# Patient Record
Sex: Male | Born: 1960 | Race: Black or African American | Hispanic: No | Marital: Single | Smoking: Never smoker
Health system: Southern US, Community
[De-identification: ages and names within clinical notes are randomized; demographics above are authoritative.]

## PROBLEM LIST (undated history)

## (undated) HISTORY — PX: KIDNEY DONATION: SHX685

## (undated) HISTORY — PX: TONSILLECTOMY: SUR1361

## (undated) HISTORY — PX: OTHER SURGICAL HISTORY: SHX169

---

## 2010-12-13 ENCOUNTER — Emergency Department (HOSPITAL_COMMUNITY): Payer: Self-pay

## 2010-12-13 ENCOUNTER — Inpatient Hospital Stay (HOSPITAL_COMMUNITY)
Admission: EM | Admit: 2010-12-13 | Discharge: 2010-12-14 | DRG: 313 | Disposition: A | Discharge status: Home / Self Care | Payer: Self-pay | Attending: Internal Medicine | Admitting: Internal Medicine

## 2010-12-13 DIAGNOSIS — Z8249 Family history of ischemic heart disease and other diseases of the circulatory system: Secondary | ICD-10-CM

## 2010-12-13 DIAGNOSIS — R0789 Other chest pain: Principal | ICD-10-CM | POA: Diagnosis present

## 2010-12-13 LAB — DIFFERENTIAL
Eosinophils Relative: 2 % (ref 0–5)
Lymphocytes Relative: 21 % (ref 12–46)
Lymphs Abs: 2 10*3/uL (ref 0.7–4.0)
Monocytes Absolute: 0.5 10*3/uL (ref 0.1–1.0)
Neutro Abs: 7 10*3/uL (ref 1.7–7.7)

## 2010-12-13 LAB — COMPREHENSIVE METABOLIC PANEL
ALT: 14 U/L (ref 0–53)
Alkaline Phosphatase: 69 U/L (ref 39–117)
BUN: 12 mg/dL (ref 6–23)
CO2: 25 mEq/L (ref 19–32)
Calcium: 9.3 mg/dL (ref 8.4–10.5)
GFR calc non Af Amer: 59 mL/min — ABNORMAL LOW (ref >60)
Glucose, Bld: 116 mg/dL — ABNORMAL HIGH (ref 70–99)
Potassium: 3.6 mEq/L (ref 3.5–5.1)
Total Protein: 7.2 g/dL (ref 6.0–8.3)

## 2010-12-13 LAB — CARDIAC PANEL(CRET KIN+CKTOT+MB+TROPI)
CK, MB: 0.9 ng/mL (ref 0.3–4.0)
Relative Index: 0.6 (ref 0.0–2.5)
Total CK: 163 U/L (ref 7–232)
Troponin I: <0.01 ng/mL (ref 0.00–0.06)

## 2010-12-13 LAB — RAPID URINE DRUG SCREEN, HOSP PERFORMED
Barbiturates: NOT DETECTED
Benzodiazepines: NOT DETECTED

## 2010-12-13 LAB — CBC
HCT: 38.7 % — ABNORMAL LOW (ref 39.0–52.0)
Hemoglobin: 12.5 g/dL — ABNORMAL LOW (ref 13.0–17.0)
MCHC: 32.3 g/dL (ref 30.0–36.0)
MCV: 91.3 fL (ref 78.0–100.0)
RDW: 14 % (ref 11.5–15.5)

## 2010-12-13 LAB — D-DIMER, QUANTITATIVE: D-Dimer, Quant: 0.26 ug/mL-FEU (ref 0.00–0.48)

## 2010-12-14 LAB — HEMOGLOBIN A1C
Hgb A1c MFr Bld: 5.8 % — ABNORMAL HIGH (ref <5.7)
Mean Plasma Glucose: 120 mg/dL — ABNORMAL HIGH (ref <117)

## 2010-12-14 LAB — TSH: TSH: 1.734 u[IU]/mL (ref 0.350–4.500)

## 2010-12-14 LAB — DIFFERENTIAL
Basophils Absolute: 0 10*3/uL (ref 0.0–0.1)
Basophils Relative: 0 % (ref 0–1)
Eosinophils Absolute: 0.2 10*3/uL (ref 0.0–0.7)
Eosinophils Relative: 2 % (ref 0–5)
Lymphocytes Relative: 24 % (ref 12–46)
Lymphs Abs: 2 10*3/uL (ref 0.7–4.0)
Monocytes Absolute: 0.4 10*3/uL (ref 0.1–1.0)
Monocytes Relative: 5 % (ref 3–12)
Neutro Abs: 5.7 10*3/uL (ref 1.7–7.7)
Neutrophils Relative %: 69 % (ref 43–77)

## 2010-12-14 LAB — CBC
HCT: 35.8 % — ABNORMAL LOW (ref 39.0–52.0)
Hemoglobin: 11.6 g/dL — ABNORMAL LOW (ref 13.0–17.0)
MCH: 29.4 pg (ref 26.0–34.0)
MCHC: 32.4 g/dL (ref 30.0–36.0)
MCV: 90.9 fL (ref 78.0–100.0)
Platelets: 210 10*3/uL (ref 150–400)
RBC: 3.94 MIL/uL — ABNORMAL LOW (ref 4.22–5.81)
RDW: 13.7 % (ref 11.5–15.5)
WBC: 8.3 10*3/uL (ref 4.0–10.5)

## 2010-12-14 LAB — BASIC METABOLIC PANEL
BUN: 10 mg/dL (ref 6–23)
CO2: 23 mEq/L (ref 19–32)
Calcium: 8.6 mg/dL (ref 8.4–10.5)
Chloride: 104 mEq/L (ref 96–112)
Creatinine, Ser: 1.28 mg/dL (ref 0.4–1.5)
GFR calc Af Amer: >60 mL/min (ref >60)
GFR calc non Af Amer: 60 mL/min — ABNORMAL LOW (ref >60)
Glucose, Bld: 116 mg/dL — ABNORMAL HIGH (ref 70–99)
Potassium: 3.9 mEq/L (ref 3.5–5.1)
Sodium: 133 mEq/L — ABNORMAL LOW (ref 135–145)

## 2010-12-14 LAB — LIPID PANEL
Cholesterol: 189 mg/dL (ref 0–200)
HDL: 39 mg/dL — ABNORMAL LOW (ref >39)
LDL Cholesterol: 132 mg/dL — ABNORMAL HIGH (ref 0–99)
Total CHOL/HDL Ratio: 4.8 RATIO
Triglycerides: 89 mg/dL (ref <150)
VLDL: 18 mg/dL (ref 0–40)

## 2010-12-14 LAB — CARDIAC PANEL(CRET KIN+CKTOT+MB+TROPI)
CK, MB: 0.7 ng/mL (ref 0.3–4.0)
Relative Index: 0.5 (ref 0.0–2.5)
Total CK: 143 U/L (ref 7–232)
Troponin I: <0.01 ng/mL (ref 0.00–0.06)

## 2010-12-20 NOTE — H&P (Signed)
[name], [name]              ACCOUNT NO.:  [id number]  MEDICAL RECORD NO.:  [id number]           PATIENT TYPE:  I  LOCATION:  A304                          FACILITY:  [facility]  PHYSICIAN:  Celso Amy, MD   DATE OF BIRTH:  [birth year]  DATE OF ADMISSION:  12/13/2010 DATE OF DISCHARGE:  LH                             HISTORY & PHYSICAL   PRIMARY CARE:  The patient does not have any primary care.  CHIEF COMPLAINT:  Chest pain.  HISTORY OF PRESENT ILLNESS:  The patient is a 50 year old African American male with no significant past medical history who presented to ER with chief complaint of chest pain.  History of present illness dates back to this past Wednesday when the patient had sudden onset of chest pain mid sternal, feel like somebody squeezing my heart.  The patient says that the pain last around 30 minutes only.  It is intermittent. The patient is not aware of any exacerbating or any relieving factors. The patient says the pain comes on and goes off by itself.  The patient says at the time of presentation, the pain was 0/10 and the pain was made worse with nitroglycerin.  No cough, no fever no chills.  No complaint of any upper respiratory symptoms.  No complaint of recent travel.  No complaint of any trauma or fall.  No complaint of any focal motor neurological deficits.  No complaint of neck pain.  REVIEW OF SYSTEMS:  Negative.  ALLERGIES:  The patient is allergic to [facility]  This causes him to have tongue swelling and the patient cannot breath.  SOCIAL HISTORY:  The patient is nonsmoker and nondrinker.  Denies illegal drug abuse.  PAST MEDICAL HISTORY:  No significant past medical history.  FAMILY HISTORY:  The patient has strong family history of coronary artery disease.  His father died at the age of 76 from MI.  The patient's grandfather died at the age of 77 with CHF.  MEDICATION:  The patient is not on any prescribed medication.  PHYSICAL  EXAMINATION:  VITALS:  Blood pressure 109/60, pulse 93, respiratory rate is 16, temperature afebrile. GENERAL:  The patient is awake, alert, well-built, lying comfortably on the bed, oriented to time, place and person. HEENT:  Pupils equally reactive to light and accommodation.  Extraocular movements were intact and head atraumatic and normocephalic. NECK:  Supple.  JVP is not elevated. RESPIRATORY:  No acute respiratory distress.  Chest is clear to auscultation bilaterally.  No rhonchi.  No crackles were heard. CARDIOVASCULAR:  S1 and S2 regular in rate and rhythm.  No murmurs or rubs were appreciated. GI:  Abdomen is obese.  Bowel sounds present, soft, nontender and nondistended. EXTREMITIES:  No extremity edema. CNS:  Cranial nerves II to XII were grossly intact.  The patient has both upper extremity and lower extremity strength and normal 5/5.  LABORATORY DATA:  WBC 9.7, the patient's hemoglobin 12.5, the patient's platelet 242.  Sodium 138, potassium 3.6, the patient's chloride 106, bicarb 25, BUN 12, serum creatinine 1.3, glucose 116.  The patient's first set of trop is 0.05.  Chest x-ray shows  no acute cardiopulmonary process.  EKG is normal sinus rhythm.  D-dimer is normal at 0.26.  IMPRESSION: 1. Cardiovascular.  The patient is being admitted with chest pain.     The patient has risk factor of obesity and a strong family history     of coronary artery disease. 2. Deep venous thrombosis.  We will keep the patient on DVT     prophylaxis.  PLAN: 1. We will admit the patient to tele. 2. We will cycle trop's q.8 x3. 3. We will start aspirin. 4. The patient's blood pressure is in the lower sides, so we will hold     beta-blockers for now. 5. We will get 2-D echo. 6. We will get urine tox screen. 7. The patient's further course depends on how the patient does during     his hospital stay.     Celso Amy, MD     MB/MEDQ  D:  12/13/2010  T:  12/14/2010  Job:   [id number]  Electronically Signed by Celso Amy M.D. on 12/20/2010 03:09:16 PM

## 2010-12-20 NOTE — Discharge Summary (Signed)
  NAME:  [name], [name]              ACCOUNT NO.:  [id number]  MEDICAL RECORD NO.:  [id number]           PATIENT TYPE:  I  LOCATION:  A304                          FACILITY:  [facility]  PHYSICIAN:  Celso Amy, MD   DATE OF BIRTH:  Jun 09, 1961  DATE OF ADMISSION:  12/13/2010 DATE OF DISCHARGE:  LH                              DISCHARGE SUMMARY   PRIMARY CARE PHYSICIAN:  The patient does not have primary care at the time of admission as the patient was unlisted and [employer] were on-call on that day, so the patient will see Dr. Regino Schultze at discharge.  LAB TESTS:  The patient's troponin were negative.  IMAGING:  The patient had chest x-ray, which was negative.  EKG had normal sinus rhythm.  HOSPITAL COURSE AND TREATMENT: 1. Please see H and P for details.  The patient was admitted with     chest pain.  The patient was admitted on telemetry floor and was     ruled out for MI.  The patient's risk factors for coronary artery    disease are strong family history and obesity. 2. DVT.  The patient was kept on DVT prophylaxis. 3. Social.  The patient is being discharged after being ruled out.  FOLLOWUP:  The patient is to follow up with [facility] for his primary care.  The patient will need a stress test as outpatient.  ADMITTING DIAGNOSIS:  Rule out myocardial infarction.  DISCHARGE DIAGNOSIS: 1. Myocardial infarction ruled out. 2. Family history of coronary artery disease.  DISCHARGE MEDICATIONS: 1. Aspirin 81 mg p.o. daily. 2. Lipitor 10 mg p.o. daily.     Celso Amy, MD     MB/MEDQ  D:  12/14/2010  T:  12/15/2010  Job:  [id number]  Electronically Signed by Celso Amy M.D. on 12/20/2010 03:09:29 PM

## 2012-01-31 ENCOUNTER — Encounter (HOSPITAL_COMMUNITY): Payer: Self-pay | Admitting: *Deleted

## 2012-01-31 ENCOUNTER — Emergency Department (HOSPITAL_COMMUNITY): Payer: PRIVATE HEALTH INSURANCE

## 2012-01-31 ENCOUNTER — Emergency Department (HOSPITAL_COMMUNITY)
Admission: EM | Admit: 2012-01-31 | Discharge: 2012-02-01 | Payer: PRIVATE HEALTH INSURANCE | Attending: Emergency Medicine | Admitting: Emergency Medicine

## 2012-01-31 DIAGNOSIS — R42 Dizziness and giddiness: Secondary | ICD-10-CM | POA: Insufficient documentation

## 2012-01-31 DIAGNOSIS — R51 Headache: Secondary | ICD-10-CM | POA: Insufficient documentation

## 2012-01-31 DIAGNOSIS — E785 Hyperlipidemia, unspecified: Secondary | ICD-10-CM | POA: Insufficient documentation

## 2012-01-31 DIAGNOSIS — R112 Nausea with vomiting, unspecified: Secondary | ICD-10-CM | POA: Insufficient documentation

## 2012-01-31 LAB — CBC
Hemoglobin: 12.3 g/dL — ABNORMAL LOW (ref 13.0–17.0)
MCH: 28.7 pg (ref 26.0–34.0)
MCHC: 32.6 g/dL (ref 30.0–36.0)
Platelets: 305 10*3/uL (ref 150–400)
RBC: 4.28 MIL/uL (ref 4.22–5.81)

## 2012-01-31 LAB — GLUCOSE, CAPILLARY: Glucose-Capillary: 149 mg/dL — ABNORMAL HIGH (ref 70–99)

## 2012-01-31 LAB — DIFFERENTIAL
Basophils Relative: 0 % (ref 0–1)
Eosinophils Absolute: 0 10*3/uL (ref 0.0–0.7)
Lymphs Abs: 1 10*3/uL (ref 0.7–4.0)
Neutro Abs: 8.5 10*3/uL — ABNORMAL HIGH (ref 1.7–7.7)
Neutrophils Relative %: 86 % — ABNORMAL HIGH (ref 43–77)

## 2012-01-31 LAB — BASIC METABOLIC PANEL
Chloride: 103 mEq/L (ref 96–112)
GFR calc Af Amer: 78 mL/min — ABNORMAL LOW (ref >90)
GFR calc non Af Amer: 68 mL/min — ABNORMAL LOW (ref >90)
Potassium: 4.1 mEq/L (ref 3.5–5.1)
Sodium: 138 mEq/L (ref 135–145)

## 2012-01-31 MED ORDER — SODIUM CHLORIDE 0.9 % IV BOLUS (SEPSIS)
1000.0000 mL | Freq: Once | INTRAVENOUS | Status: AC
  Administered 2012-01-31: 1000 mL via INTRAVENOUS

## 2012-01-31 MED ORDER — METOCLOPRAMIDE HCL 5 MG/ML IJ SOLN
10.0000 mg | Freq: Once | INTRAMUSCULAR | Status: AC
  Administered 2012-01-31: 10 mg via INTRAVENOUS
  Filled 2012-01-31: qty 2

## 2012-01-31 MED ORDER — HYDROMORPHONE HCL PF 1 MG/ML IJ SOLN
1.0000 mg | Freq: Once | INTRAMUSCULAR | Status: AC
  Administered 2012-01-31: 1 mg via INTRAVENOUS

## 2012-01-31 MED ORDER — HYDROMORPHONE HCL PF 1 MG/ML IJ SOLN: 1.0000 mg | INTRAMUSCULAR | Status: DC | PRN

## 2012-01-31 MED ORDER — HYDROMORPHONE HCL PF 1 MG/ML IJ SOLN
INTRAMUSCULAR | Status: AC
  Administered 2012-01-31: 1 mg via INTRAVENOUS
  Filled 2012-01-31: qty 1

## 2012-01-31 MED ORDER — DIPHENHYDRAMINE HCL 50 MG/ML IJ SOLN
25.0000 mg | Freq: Once | INTRAMUSCULAR | Status: AC
  Administered 2012-01-31: 25 mg via INTRAVENOUS
  Filled 2012-01-31: qty 1

## 2012-01-31 NOTE — ED Notes (Signed)
I have personally seen and examined the patient.  I have discussed the plan of care with the resident.  I have reviewed the documentation on PMH/FH/Soc. History.  I have reviewed the documentation of the resident and agree.  I performed the lumbar puncture documented  Joya Gaskins, MD 01/31/12 2350

## 2012-01-31 NOTE — ED Provider Notes (Signed)
9:13 PM  Pt seen with resident He reports HA that woke him up with intense vomiting upon movement He reports never having this HA previously He reports brief syncopal event due to pain but no injury reported No focal arm/leg drift, face is symmetric Will start with CT head Pt denies trauma Pt currently in handcuffs with office present      Date: 01/31/2012  Rate: 93  Rhythm: normal sinus rhythm  QRS Axis: normal  Intervals: normal  ST/T Wave abnormalities: nonspecific ST changes  Conduction Disutrbances:none  Narrative Interpretation:   Old EKG Reviewed: unchanged  10:52 PM CT head negative Given history, concerning for [facility], will proceed with LP Pt agreeable He is not on anticoagulants No h/o back surgery or back pain  11:42 PM LP completed without issue, no complications, pt is neuro intact after procedure At signout, f/u on CSF results.  If gram stain negative and CSF does not reveal significant RBCs, safe for d/c but should have f/u with neuro/nsgy given family h/o aneurysms  Joya Gaskins, MD 01/31/12 [phone]

## 2012-01-31 NOTE — Discharge Instructions (Signed)

## 2012-01-31 NOTE — ED Notes (Signed)
Pt arrived via [employer] [facility], c/o vomiting starting this am

## 2012-01-31 NOTE — ED Notes (Addendum)
MD preforming lumbar puncture at this time assisted by MD resident

## 2012-01-31 NOTE — ED Provider Notes (Signed)
History     CSN: [id number]  Arrival date & time 01/31/12  1959   First MD Initiated Contact with Patient 01/31/12 2009      Chief Complaint  Patient presents with  . headache    HPI The patient is a 51 year old man with a past medical history of hyperlipidemia, post status of left kidney donation (2004), who presents with severe headache.  Patient reports that  he woke up with severe headache at 4:00 this morning. He headache is located at the top of his head, 9/10 in severity, sharp and non-radiation. It is aggravated by sitting up and alleviated by laying flat. It is associated with nausea and vomiting. Patient has vomited many times food materials without blood in it. His headache is also associated with blurry vision,  but no hearing change or photophobia. Patient states that when he tried to get up and go to the bathroom, he passed out. He did not bite his tongue or injure any part of his body. He lost control of his bladder, but not bowel movement. At the beginning he had tingling sensation in his toes bilaterally, but does not have it now when I interviewed the patient. There's no weakness or numbness in his extremities. Patient denies any recent injury to his head.   Of note, patient mentioned that his mother died of  brain vascular aneurysm at age of 108.  Denies fever, chills, cough, chest pain, SOB,  diarrhea, constipation, dysuria, urgency, frequency, hematuria, joint pain or leg swelling.  History reviewed. No pertinent past medical history.  Past Surgical History  Procedure Date  . Kidney donation     No family history on file.  History  Substance Use Topics  . Smoking status: Never Smoker   . Smokeless tobacco: Not on file  . Alcohol Use: No    Review of Systems  Constitutional: Negative for fever, chills, weight loss and diaphoresis.  HENT: Negative for hearing loss, ear pain, nosebleeds, congestion, sore throat, tinnitus and ear discharge.   Eyes: Positive  for blurred vision. Negative for double vision, photophobia, pain, discharge and redness.  Respiratory: Negative for apnea, cough, choking, chest tightness, shortness of breath, wheezing and stridor.   Cardiovascular: Negative for chest pain, palpitations and leg swelling.  Gastrointestinal: Positive for nausea, vomiting and abdominal pain. Negative for diarrhea, constipation, blood in stool, abdominal distention, anal bleeding and rectal pain.       Patient has pain in his abdomen when he vomits. He thinks that is due to vomiting-induced abdominal wall muscle pain.  Genitourinary: Negative for dysuria, urgency, frequency, hematuria, difficulty urinating and penile pain.  Musculoskeletal: Negative for back pain, joint swelling, arthralgias and gait problem.  Skin: Negative for color change, itching, pallor, rash and wound.  Neurological: Positive for dizziness and headaches. Negative for tremors, seizures, syncope, facial asymmetry, weakness, light-headedness and numbness.  Hematological: Negative for adenopathy. Does not bruise/bleed easily.  Psychiatric/Behavioral: Negative for hallucinations, behavioral problems, confusion, dysphoric mood, decreased concentration and agitation.    Review of Systems  Constitutional: Negative for fever, chills, weight loss and diaphoresis.  HENT: Negative for hearing loss, ear pain, nosebleeds, congestion, sore throat, tinnitus and ear discharge.   Eyes: Positive for blurred vision. Negative for double vision, photophobia, pain, discharge and redness.  Respiratory: Negative for apnea, cough, choking, chest tightness, shortness of breath, wheezing and stridor.   Cardiovascular: Negative for chest pain, palpitations and leg swelling.  Gastrointestinal: Positive for nausea, vomiting and abdominal pain. Negative for diarrhea,  constipation, blood in stool, abdominal distention, anal bleeding and rectal pain.       Patient has pain in his abdomen when he vomits. He  thinks that is due to vomiting-induced abdominal wall muscle pain.  Genitourinary: Negative for dysuria, urgency, frequency, hematuria, difficulty urinating and penile pain.  Musculoskeletal: Negative for back pain, joint swelling, arthralgias and gait problem.  Skin: Negative for color change, itching, pallor, rash and wound.  Neurological: Positive for dizziness and headaches. Negative for tremors, seizures, syncope, facial asymmetry, weakness, light-headedness and numbness.  Endo/Heme/Allergies: Negative for adenopathy. Does not bruise/bleed easily.  Psychiatric/Behavioral: Negative for hallucinations, behavioral problems, confusion, dysphoric mood, decreased concentration and agitation.     Allergies  Shellfish allergy  Home Medications  No current outpatient prescriptions on file.  BP 134/78  Pulse 114  Temp(Src) 98.9 F (37.2 C) (Oral)  Resp 20  Ht 5\' 8"  (1.727 m)  Wt 255 lb (115.667 kg)  BMI 38.77 kg/m2  SpO2 94%  Physical Exam  General: resting in bed, not in acute distress HEENT: PERRL, EOMI, no scleral icterus Cardiac: S1/S2, RRR, No murmurs, gallops or rubs Pulm: Good air movement bilaterally, Clear to auscultation bilaterally, No rales, wheezing, rhonchi or rubs. Abd: Soft,  nondistended, nontender, no rebound pain, no organomegaly, BS present Ext: No rashes or edema, 2+DP/PT pulse bilaterally Skin: no rashes. No skin bruise. Neuro: alert and oriented X3, cranial nerves II-XII grossly intact, muscle strength 5/5 in all extremeties,  sensation to light touch intact. Negative Kernig's sign and Brudzinski's sign. No Babinski's sign. 2+ brachial and knee reflexes bilaterally. Psych.: patient is not psychotic, no suicidal or hemocidal ideation.    ED Course  LUMBAR PUNCTURE Date/Time: 01/31/2012 11:30 PM Performed by: Joya Gaskins Authorized by: Joya Gaskins Consent: Verbal consent obtained. Written consent obtained. Risks and benefits: risks, benefits  and alternatives were discussed Consent given by: patient Patient understanding: patient states understanding of the procedure being performed Patient identity confirmed: verbally with patient and arm band Time out: Immediately prior to procedure a "time out" was called to verify the correct patient, procedure, equipment, support staff and site/side marked as required. Indications: evaluation for subarachnoid hemorrhage Anesthesia: local infiltration Local anesthetic: lidocaine 1% without epinephrine Anesthetic total: 5 ml Patient sedated: no Lumbar space: L4-L5 interspace Patient's position: sitting Needle gauge: 20 Needle type: spinal needle - Quincke tip Needle length: 3.5 in Number of attempts: 2 Fluid appearance: clear Tubes of fluid: 4 Total volume: 8 ml Post-procedure: site cleaned and adhesive bandage applied Patient tolerance: Patient tolerated the procedure well with no immediate complications.   (including critical care time)  Patient's headache is likely due to subarachnoid hemorrhage given his sudden onset and severe pain, also his positive family history. Other differential diagnosis includes, but less likely, meningitis (althoug patient is incarceration which put him at higher risk for Nessirial meningitis. But he dose not have fever, no neck stiffness,  Has negative Kernig's sign and Brudzinski's sign), cluster headache (not typical in symptom, not located unilaterally) and migraine headache (patient does not have history for this diagnosis).   Will do CT-head without contrast. Will get CBC and BMP. Will treat symptomatically for pain and nausea.  Labs Reviewed  GLUCOSE, CAPILLARY - Abnormal; Notable for the following:    Glucose-Capillary 149 (*)    All other components within normal limits  CBC  DIFFERENTIAL  BASIC METABOLIC PANEL   No results found.   No diagnosis found.    MDM  Lorretta Harp, MD 01/31/12 [id number]  Joya Gaskins, MD 01/31/12 [phone]

## 2012-01-31 NOTE — ED Notes (Signed)
Pt tolerated lumbar puncture. Vitals stable no signs of distress

## 2012-02-01 LAB — CSF CELL COUNT WITH DIFFERENTIAL
RBC Count, CSF: 10 /mm3 — ABNORMAL HIGH
RBC Count, CSF: 3 /mm3 — ABNORMAL HIGH
Tube #: 4
WBC, CSF: 1 /mm3 (ref 0–5)
WBC, CSF: 7 /mm3 — ABNORMAL HIGH (ref 0–5)

## 2012-02-01 LAB — PROTEIN, CSF: Total  Protein, CSF: 20 mg/dL (ref 15–45)

## 2012-02-01 LAB — GLUCOSE, CSF: Glucose, CSF: 88 mg/dL — ABNORMAL HIGH (ref 43–76)

## 2012-02-01 NOTE — ED Provider Notes (Signed)
0015 Assumed care/disposition of patient. Patient with HA associated with nausea, vomiting and syncopal episode. Negative Ct. LP without blood. Awaiting CSF. 0200 CSF without frank infection. Colorless, clear. RBCs range from 3-10. WBC 7 with 87 lymph in tube 1 but too few to count in tube 4. Findings are not c/w with SAH. Patient has received IVF. No c/o headache currently. Discussed results with patient. Will discharge back to facility.  Nicoletta Dress. Colon Branch, MD 02/01/12 [phone]

## 2012-02-05 LAB — CSF CULTURE W GRAM STAIN: Culture: NO GROWTH

## 2015-06-19 ENCOUNTER — Emergency Department (HOSPITAL_COMMUNITY)

## 2015-06-19 ENCOUNTER — Emergency Department (HOSPITAL_COMMUNITY): Payer: Self-pay

## 2015-06-19 ENCOUNTER — Emergency Department (HOSPITAL_COMMUNITY)
Admission: EM | Admit: 2015-06-19 | Discharge: 2015-06-19 | Disposition: A | Discharge status: Home / Self Care | Payer: Self-pay | Attending: Emergency Medicine | Admitting: Emergency Medicine

## 2015-06-19 ENCOUNTER — Encounter (HOSPITAL_COMMUNITY): Payer: Self-pay | Admitting: Cardiology

## 2015-06-19 DIAGNOSIS — S299XXA Unspecified injury of thorax, initial encounter: Secondary | ICD-10-CM | POA: Insufficient documentation

## 2015-06-19 DIAGNOSIS — S60512A Abrasion of left hand, initial encounter: Secondary | ICD-10-CM | POA: Insufficient documentation

## 2015-06-19 DIAGNOSIS — Y998 Other external cause status: Secondary | ICD-10-CM | POA: Insufficient documentation

## 2015-06-19 DIAGNOSIS — S80211A Abrasion, right knee, initial encounter: Secondary | ICD-10-CM | POA: Insufficient documentation

## 2015-06-19 DIAGNOSIS — Y92410 Unspecified street and highway as the place of occurrence of the external cause: Secondary | ICD-10-CM | POA: Insufficient documentation

## 2015-06-19 DIAGNOSIS — R42 Dizziness and giddiness: Secondary | ICD-10-CM | POA: Insufficient documentation

## 2015-06-19 DIAGNOSIS — S0101XA Laceration without foreign body of scalp, initial encounter: Secondary | ICD-10-CM | POA: Insufficient documentation

## 2015-06-19 DIAGNOSIS — S60511A Abrasion of right hand, initial encounter: Secondary | ICD-10-CM | POA: Insufficient documentation

## 2015-06-19 DIAGNOSIS — Y9389 Activity, other specified: Secondary | ICD-10-CM | POA: Insufficient documentation

## 2015-06-19 DIAGNOSIS — S060X0A Concussion without loss of consciousness, initial encounter: Secondary | ICD-10-CM | POA: Insufficient documentation

## 2015-06-19 LAB — URINALYSIS, ROUTINE W REFLEX MICROSCOPIC
Bilirubin Urine: NEGATIVE
GLUCOSE, UA: NEGATIVE mg/dL
Ketones, ur: NEGATIVE mg/dL
Nitrite: NEGATIVE
PROTEIN: NEGATIVE mg/dL
Urobilinogen, UA: 0.2 mg/dL (ref 0.0–1.0)
pH: 6 (ref 5.0–8.0)

## 2015-06-19 LAB — COMPREHENSIVE METABOLIC PANEL
ALBUMIN: 3.8 g/dL (ref 3.5–5.0)
ALT: 19 U/L (ref 17–63)
AST: 44 U/L — AB (ref 15–41)
Alkaline Phosphatase: 65 U/L (ref 38–126)
Anion gap: 6 (ref 5–15)
BUN: 11 mg/dL (ref 6–20)
CHLORIDE: 109 mmol/L (ref 101–111)
CO2: 25 mmol/L (ref 22–32)
CREATININE: 1.31 mg/dL — AB (ref 0.61–1.24)
Calcium: 9 mg/dL (ref 8.9–10.3)
GFR calc Af Amer: >60 mL/min (ref >60)
GLUCOSE: 108 mg/dL — AB (ref 65–99)
POTASSIUM: 3.8 mmol/L (ref 3.5–5.1)
SODIUM: 140 mmol/L (ref 135–145)
Total Bilirubin: 0.3 mg/dL (ref 0.3–1.2)
Total Protein: 7.5 g/dL (ref 6.5–8.1)

## 2015-06-19 LAB — CBC WITH DIFFERENTIAL/PLATELET
BASOS ABS: 0 10*3/uL (ref 0.0–0.1)
BASOS PCT: 0 %
EOS ABS: 0.1 10*3/uL (ref 0.0–0.7)
EOS PCT: 1 %
HCT: 39.2 % (ref 39.0–52.0)
Hemoglobin: 12.4 g/dL — ABNORMAL LOW (ref 13.0–17.0)
LYMPHS PCT: 14 %
Lymphs Abs: 1.3 10*3/uL (ref 0.7–4.0)
MCH: 28.7 pg (ref 26.0–34.0)
MCHC: 31.6 g/dL (ref 30.0–36.0)
MCV: 90.7 fL (ref 78.0–100.0)
MONO ABS: 0.5 10*3/uL (ref 0.1–1.0)
Monocytes Relative: 5 %
Neutro Abs: 7.6 10*3/uL (ref 1.7–7.7)
Neutrophils Relative %: 80 %
PLATELETS: 241 10*3/uL (ref 150–400)
RBC: 4.32 MIL/uL (ref 4.22–5.81)
RDW: 14.3 % (ref 11.5–15.5)
WBC: 9.4 10*3/uL (ref 4.0–10.5)

## 2015-06-19 LAB — URINE MICROSCOPIC-ADD ON

## 2015-06-19 MED ORDER — FENTANYL CITRATE (PF) 100 MCG/2ML IJ SOLN
50.0000 ug | Freq: Once | INTRAMUSCULAR | Status: AC
  Administered 2015-06-19: 50 ug via INTRAVENOUS
  Filled 2015-06-19: qty 2

## 2015-06-19 MED ORDER — SODIUM CHLORIDE 0.9 % IV BOLUS (SEPSIS)
500.0000 mL | Freq: Once | INTRAVENOUS | Status: AC
  Administered 2015-06-19: 500 mL via INTRAVENOUS

## 2015-06-19 MED ORDER — IOHEXOL 300 MG/ML  SOLN
100.0000 mL | Freq: Once | INTRAMUSCULAR | Status: AC | PRN
  Administered 2015-06-19: 100 mL via INTRAVENOUS

## 2015-06-19 MED ORDER — SODIUM BICARBONATE 4 % IV SOLN
5.0000 mL | Freq: Once | INTRAVENOUS | Status: DC
  Filled 2015-06-19: qty 5

## 2015-06-19 MED ORDER — LIDOCAINE HCL (PF) 1 % IJ SOLN
5.0000 mL | Freq: Once | INTRAMUSCULAR | Status: DC
  Filled 2015-06-19: qty 5

## 2015-06-19 MED ORDER — POVIDONE-IODINE 10 % EX SOLN
CUTANEOUS | Status: AC
  Filled 2015-06-19: qty 118

## 2015-06-19 MED ORDER — MORPHINE SULFATE (PF) 4 MG/ML IV SOLN
4.0000 mg | Freq: Once | INTRAVENOUS | Status: AC
  Administered 2015-06-19: 4 mg via INTRAVENOUS
  Filled 2015-06-19: qty 1
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
## 2015-06-19 NOTE — ED Notes (Signed)
Dr Pickering at bedside,  
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
## 2015-07-05 ENCOUNTER — Emergency Department (HOSPITAL_COMMUNITY)
Admission: EM | Admit: 2015-07-05 | Discharge: 2015-07-05 | Disposition: A | Discharge status: Home / Self Care | Attending: Emergency Medicine | Admitting: Emergency Medicine

## 2015-07-05 ENCOUNTER — Encounter (HOSPITAL_COMMUNITY): Payer: Self-pay | Admitting: *Deleted

## 2015-07-05 DIAGNOSIS — R42 Dizziness and giddiness: Secondary | ICD-10-CM | POA: Insufficient documentation

## 2015-07-05 DIAGNOSIS — Z4802 Encounter for removal of sutures: Secondary | ICD-10-CM
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
## 2015-07-05 NOTE — ED Notes (Signed)
Suture removal. Sutures placed to left scalp on 10/5.
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
## 2015-12-24 ENCOUNTER — Emergency Department (HOSPITAL_COMMUNITY)
Admission: EM | Admit: 2015-12-24 | Discharge: 2015-12-24 | Disposition: A | Discharge status: Home / Self Care | Attending: Emergency Medicine | Admitting: Emergency Medicine

## 2015-12-24 ENCOUNTER — Encounter (HOSPITAL_COMMUNITY): Payer: Self-pay | Admitting: Emergency Medicine

## 2015-12-24 DIAGNOSIS — S61011A Laceration without foreign body of right thumb without damage to nail, initial encounter: Secondary | ICD-10-CM

## 2015-12-24 DIAGNOSIS — Y929 Unspecified place or not applicable: Secondary | ICD-10-CM | POA: Insufficient documentation

## 2015-12-24 DIAGNOSIS — W260XXA Contact with knife, initial encounter: Secondary | ICD-10-CM | POA: Insufficient documentation

## 2015-12-24 DIAGNOSIS — Y9389 Activity, other specified: Secondary | ICD-10-CM | POA: Insufficient documentation

## 2015-12-24 DIAGNOSIS — Z23 Encounter for immunization: Secondary | ICD-10-CM | POA: Insufficient documentation

## 2015-12-24 DIAGNOSIS — Y999 Unspecified external cause status: Secondary | ICD-10-CM | POA: Insufficient documentation

## 2015-12-24 MED ORDER — CEPHALEXIN 500 MG PO CAPS: 500.0000 mg | ORAL_CAPSULE | Freq: Four times a day (QID) | ORAL | Status: AC

## 2015-12-24 MED ORDER — TETANUS-DIPHTH-ACELL PERTUSSIS 5-2.5-18.5 LF-MCG/0.5 IM SUSP
0.5000 mL | Freq: Once | INTRAMUSCULAR | Status: AC
  Administered 2015-12-24: 0.5 mL via INTRAMUSCULAR
  Filled 2015-12-24: qty 0.5

## 2015-12-24 MED ORDER — LIDOCAINE HCL (PF) 1 % IJ SOLN
5.0000 mL | Freq: Once | INTRAMUSCULAR | Status: DC
  Filled 2015-12-24: qty 5

## 2015-12-24 NOTE — ED Notes (Signed)
Patient cleaned hands with soap and water at sink in patient room. Covered wound with tefla and wrapped in sterile gauze. Patient tolerated well.
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
## 2015-12-24 NOTE — Discharge Instructions (Signed)
Laceration Care, Adult  A laceration is a cut that goes through all layers of the skin. The cut also goes into the tissue that is right under the skin. Some cuts heal on their own. Others need to be closed with stitches (sutures), staples, skin adhesive strips, or wound glue. Taking care of your cut lowers your risk of infection and helps your cut to heal better.  HOW TO TAKE CARE OF YOUR CUT  For stitches or staples:  · Keep the wound clean and dry.  · If you were given a bandage (dressing), you should change it at least one time per day or as told by your doctor. You should also change it if it gets wet or dirty.  · Keep the wound completely dry for the first 24 hours or as told by your doctor. After that time, you may take a shower or a bath. However, make sure that the wound is not soaked in water until after the stitches or staples have been removed.  · Clean the wound one time each day or as told by your doctor:    Wash the wound with soap and water.    Rinse the wound with water until all of the soap comes off.    Pat the wound dry with a clean towel. Do not rub the wound.  · After you clean the wound, put a thin layer of antibiotic ointment on it as told by your doctor. This ointment:    Helps to prevent infection.    Keeps the bandage from sticking to the wound.  · Have your stitches or staples removed as told by your doctor.  If your doctor used skin adhesive strips:   · Keep the wound clean and dry.  · If you were given a bandage, you should change it at least one time per day or as told by your doctor. You should also change it if it gets dirty or wet.  · Do not get the skin adhesive strips wet. You can take a shower or a bath, but be careful to keep the wound dry.  · If the wound gets wet, pat it dry with a clean towel. Do not rub the wound.  · Skin adhesive strips fall off on their own. You can trim the strips as the wound heals. Do not remove any strips that are still stuck to the wound. They will  fall off after a while.  If your doctor used wound glue:  · Try to keep your wound dry, but you may briefly wet it in the shower or bath. Do not soak the wound in water, such as by swimming.  · After you take a shower or a bath, gently pat the wound dry with a clean towel. Do not rub the wound.  · Do not do any activities that will make you really sweaty until the skin glue has fallen off on its own.  · Do not apply liquid, cream, or ointment medicine to your wound while the skin glue is still on.  · If you were given a bandage, you should change it at least one time per day or as told by your doctor. You should also change it if it gets dirty or wet.  · If a bandage is placed over the wound, do not let the tape for the bandage touch the skin glue.  · Do not pick at the glue. The skin glue usually stays on for 5-10 days. Then, it   falls off of the skin.  General Instructions   · To help prevent scarring, make sure to cover your wound with sunscreen whenever you are outside after stitches are removed, after adhesive strips are removed, or when wound glue stays in place and the wound is healed. Make sure to wear a sunscreen of at least 30 SPF.  · Take over-the-counter and prescription medicines only as told by your doctor.  · If you were given antibiotic medicine or ointment, take or apply it as told by your doctor. Do not stop using the antibiotic even if your wound is getting better.  · Do not scratch or pick at the wound.  · Keep all follow-up visits as told by your doctor. This is important.  · Check your wound every day for signs of infection. Watch for:    Redness, swelling, or pain.    Fluid, blood, or pus.  · Raise (elevate) the injured area above the level of your heart while you are sitting or lying down, if possible.  GET HELP IF:  · You got a tetanus shot and you have any of these problems at the injection site:    Swelling.    Very bad pain.    Redness.    Bleeding.  · You have a fever.  · A wound that was  closed breaks open.  · You notice a bad smell coming from your wound or your bandage.  · You notice something coming out of the wound, such as wood or glass.  · Medicine does not help your pain.  · You have more redness, swelling, or pain at the site of your wound.  · You have fluid, blood, or pus coming from your wound.  · You notice a change in the color of your skin near your wound.  · You need to change the bandage often because fluid, blood, or pus is coming from the wound.  · You start to have a new rash.  · You start to have numbness around the wound.  GET HELP RIGHT AWAY IF:  · You have very bad swelling around the wound.  · Your pain suddenly gets worse and is very bad.  · You notice painful lumps near the wound or on skin that is anywhere on your body.  · You have a red streak going away from your wound.  · The wound is on your hand or foot and you cannot move a finger or toe like you usually can.  · The wound is on your hand or foot and you notice that your fingers or toes look pale or bluish.     This information is not intended to replace advice given to you by your health care provider. Make sure you discuss any questions you have with your health care provider.     Document Released: 02/17/2008 Document Revised: 01/15/2015 Document Reviewed: 08/27/2014  Elsevier Interactive Patient Education ©2016 [employer] Inc[employer]

## 2015-12-24 NOTE — ED Provider Notes (Signed)
CSN: [id number]     Arrival date & time 12/24/15  2142 History   First MD Initiated Contact with Patient 12/24/15 2236     Chief Complaint  Patient presents with  . Laceration     (Consider location/radiation/quality/duration/timing/severity/associated sxs/prior Treatment) HPI  [name] is a 55 y.o. male who presents to the Emergency Department complaining of laceration to his right thumb that occurred while cutting up chicken.  Incident occurred shortly before ED arrival.  He has been applying direct pressure to the wound.  He has not cleaned the wound, unsure of last Td.  He denies numbness or weakness of the digit, swelling, or anti-coagulant use.    History reviewed. No pertinent past medical history. Past Surgical History  Procedure Laterality Date  . Kidney donation    . Tonsillectomy    . Tubes in ears     History reviewed. No pertinent family history. Social History  Substance Use Topics  . Smoking status: Never Smoker   . Smokeless tobacco: None  . Alcohol Use: No    Review of Systems  Constitutional: Negative for fever and chills.  Musculoskeletal: Negative for joint swelling and arthralgias.  Skin: Positive for wound.       Laceration right thumb  Neurological: Negative for dizziness, weakness and numbness.  Hematological: Does not bruise/bleed easily.  All other systems reviewed and are negative.     Allergies  Shellfish allergy  Home Medications   Prior to Admission medications   Not on File   BP 158/90 mmHg  Pulse 89  Temp(Src) 97.8 F (36.6 C) (Oral)  Resp 20  Ht  (1.702 m)  Wt 106.595 kg  BMI 36.80 kg/m2  SpO2 100% Physical Exam  Constitutional: He is oriented to person, place, and time. He appears well-developed and well-nourished. No distress.  HENT:  Head: Normocephalic and atraumatic.  Cardiovascular: Normal rate, regular rhythm, normal heart sounds and intact distal pulses.   No murmur heard. Pulmonary/Chest: Effort  normal and breath sounds normal. No respiratory distress.  Musculoskeletal: He exhibits no edema or tenderness.       Right hand: He exhibits laceration. He exhibits normal range of motion, no tenderness, no bony tenderness, normal capillary refill and no swelling. Normal sensation noted. Normal strength noted. He exhibits no thumb/finger opposition.       Hands: Neurological: He is alert and oriented to person, place, and time. He exhibits normal muscle tone. Coordination normal.  Skin: Skin is warm. Laceration noted.  Laceration to the proximal right thumb.  Bleeding controlled.  No edema  Nursing note and vitals reviewed.   ED Course  Procedures (including critical care time) Labs Review Labs Reviewed - No data to display  Imaging Review No results found. I have personally reviewed and evaluated these images and lab results as part of my medical decision-making.   EKG Interpretation None       LACERATION REPAIR Performed by: Nathanyl Andujo L. Authorized by: Maxwell Caul Consent: Verbal consent obtained. Risks and benefits: risks, benefits and alternatives were discussed Consent given by: patient Patient identity confirmed: provided demographic data Prepped and Draped in normal sterile fashion Wound explored  Laceration Location: proximal right thumb  Laceration Length: 1.5 cm  No Foreign Bodies seen or palpated  Anesthesia: local infiltration  Local anesthetic: lidocaine 1 % w/o epinephrine  Anesthetic total: 2 ml  Irrigation method: syringe Amount of cleaning: standard  Skin closure: 4-0 prolene  Number of sutures: 3  Technique: simple interrupted  Patient tolerance: Patient tolerated the procedure well with no immediate complications.   MDM   Final diagnoses:  Laceration of thumb, right, initial encounter    Pt with laceration to proximal thumb.  Has full ROM.  NV intact, no FB's or obvious tendon injuries.  Td updated.  Dressing applied, pt  agrees to wound care instructions, sutures out in 10 days.  Return precautions given.      [name], PA-C 12/25/15 [phone]  Raeford RazorStephen Kohut, MD 12/27/15 1049

## 2017-04-25 ENCOUNTER — Emergency Department (HOSPITAL_COMMUNITY)
Admission: EM | Admit: 2017-04-25 | Discharge: 2017-04-25 | Disposition: A | Discharge status: Home / Self Care | Payer: Self-pay | Attending: Emergency Medicine | Admitting: Emergency Medicine

## 2017-04-25 ENCOUNTER — Encounter (HOSPITAL_COMMUNITY): Payer: Self-pay | Admitting: Emergency Medicine

## 2017-04-25 ENCOUNTER — Emergency Department (HOSPITAL_COMMUNITY): Payer: Self-pay

## 2017-04-25 DIAGNOSIS — M25561 Pain in right knee: Secondary | ICD-10-CM | POA: Insufficient documentation

## 2017-04-25 NOTE — ED Provider Notes (Signed)
[facility]-EMERGENCY DEPT Provider Note   CSN: [id number] Arrival date & time: 04/25/17  1418     History   Chief Complaint Chief Complaint  Patient presents with  . Leg Pain    HPI [name] is a 56 y.o. male.  The history is provided by the patient. No language interpreter was used.  Leg Pain   This is a new problem. The current episode started 2 days ago. The problem occurs constantly. The problem has been gradually worsening. The pain is present in the right knee. The quality of the pain is described as aching. The pain is moderate. Associated symptoms include limited range of motion. Pertinent negatives include no numbness. He has tried nothing for the symptoms. There has been no history of extremity trauma.  Pt reports he was walking and had a pop in the back of his knee 2 days ago.  Pt reports knee is painful and hurts to move.   History reviewed. No pertinent past medical history.  There are no active problems to display for this patient.   Past Surgical History:  Procedure Laterality Date  . KIDNEY DONATION    . TONSILLECTOMY    . tubes in ears         Home Medications    Prior to Admission medications   Medication Sig Start Date End Date Taking? Authorizing Provider  cephALEXin (KEFLEX) 500 MG capsule Take 1 capsule (500 mg total) by mouth 4 (four) times daily. For 7 days 12/24/15   Pauline Ausriplett, Tammy, PA-C    Family History History reviewed. No pertinent family history.  Social History Social History  Substance Use Topics  . Smoking status: Never Smoker  . Smokeless tobacco: Never Used  . Alcohol use No     Allergies   Shellfish allergy   Review of Systems Review of Systems  Neurological: Negative for numbness.  All other systems reviewed and are negative.    Physical Exam Updated Vital Signs BP (S) (!) 134/109 (BP Location: Left Arm)   Pulse 83   Temp 98.1 F (36.7 C) (Oral)   Resp 20   Ht 5\' 7"  (1.702 m)   Wt 108.9 kg (240 lb)    SpO2 99%   BMI 37.59 kg/m   Physical Exam  Constitutional: He is oriented to person, place, and time. He appears well-developed and well-nourished.  HENT:  Head: Normocephalic.  Musculoskeletal: He exhibits tenderness.  Tender posterior right knee,  Pain with range of motion,  nv and ns intact  Negative homan's   Neurological: He is alert and oriented to person, place, and time.  Skin: Skin is warm.  Psychiatric: He has a normal mood and affect.  Nursing note and vitals reviewed.    ED Treatments / Results  Labs (all labs ordered are listed, but only abnormal results are displayed) Labs Reviewed - No data to display  EKG  EKG Interpretation None       Radiology Dg Knee Complete 4 Views Right  Result Date: 04/25/2017 CLINICAL DATA:  Right knee pain while walking, initial encounter EXAM: RIGHT KNEE - COMPLETE 4+ VIEW COMPARISON:  None. FINDINGS: No joint effusion is noted. Small well corticated bony densities are noted along the distal medial metaphysis of the femur as well as adjacent to the lateral tibial plateau on the frontal images. These are consistent with prior trauma. The one adjacent to the lateral tibial plateau likely represents a small loose body. No acute fracture is seen. No soft tissue abnormality is  noted.Mild spurring is noted in the medial tibial plateau. IMPRESSION: Chronic appearing changes as described. No joint effusion or acute fracture is noted. Electronically Signed   By: Alcide Clever M.D.   On: 04/25/2017 15:50    Procedures Procedures (including critical care time)  Medications Ordered in ED Medications - No data to display   Initial Impression / Assessment and Plan / ED Course  I have reviewed the triage vital signs and the nursing notes.  Pertinent labs & imaging results that were available during my care of the patient were reviewed by me and considered in my medical decision making (see chart for details).     No signs or history  concerning for dvt.  I suspect cartilage problems.  Pt advised to follow up with Dr. Romeo Apple for evaluation.    Final Clinical Impressions(s) / ED Diagnoses   Final diagnoses:  Acute pain of right knee    New Prescriptions New Prescriptions   No medications on file  An After Visit Summary was printed and given to the patient.   Elson Areas, PA-C 04/25/17 1621    Samuel Jester, DO 04/28/17 [phone]

## 2017-04-25 NOTE — ED Triage Notes (Signed)
Pt reports right knee pain x2-3 days. Pt reports was walking and felt a "pop" behind right knee. Pt reports ever since not able to bear weight on RLE. nad noted.

## 2017-04-25 NOTE — Discharge Instructions (Signed)
Return if any problems.  See Dr. Harrison for evaluation  °

## 2017-04-28 ENCOUNTER — Encounter: Payer: Self-pay | Admitting: Orthopaedic Surgery

## 2017-04-28 ENCOUNTER — Ambulatory Visit: Payer: Self-pay | Admitting: Orthopaedic Surgery

## 2020-03-11 ENCOUNTER — Other Ambulatory Visit: Payer: Self-pay

## 2020-03-11 ENCOUNTER — Encounter (HOSPITAL_COMMUNITY): Payer: Self-pay | Admitting: Emergency Medicine

## 2020-03-11 ENCOUNTER — Emergency Department (HOSPITAL_COMMUNITY)
Admission: EM | Admit: 2020-03-11 | Discharge: 2020-03-11 | Disposition: A | Discharge status: Home / Self Care | Payer: Self-pay | Attending: Emergency Medicine | Admitting: Emergency Medicine

## 2020-03-11 DIAGNOSIS — H5789 Other specified disorders of eye and adnexa: Secondary | ICD-10-CM | POA: Insufficient documentation

## 2020-03-11 DIAGNOSIS — H18892 Other specified disorders of cornea, left eye: Secondary | ICD-10-CM

## 2020-03-11 MED ORDER — FLUORESCEIN SODIUM 1 MG OP STRP
1.0000 | ORAL_STRIP | Freq: Once | OPHTHALMIC | Status: AC
  Administered 2020-03-11: 1 via OPHTHALMIC
  Filled 2020-03-11: qty 1

## 2020-03-11 MED ORDER — TETRACAINE HCL 0.5 % OP SOLN
2.0000 [drp] | Freq: Once | OPHTHALMIC | Status: AC
  Administered 2020-03-11: 2 [drp] via OPHTHALMIC
  Filled 2020-03-11: qty 4

## 2020-03-11 MED ORDER — ERYTHROMYCIN 5 MG/GM OP OINT: TOPICAL_OINTMENT | OPHTHALMIC | 0 refills | Status: AC

## 2020-03-11 NOTE — Discharge Instructions (Signed)
Please use erythromycin ointment 4 times a day for 5 days Use over the counter eye drops for irritated red eye as needed for comfort Please return to the ER if you are worsening
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
## 2021-12-07 ENCOUNTER — Other Ambulatory Visit: Payer: Self-pay

## 2021-12-07 ENCOUNTER — Emergency Department (HOSPITAL_COMMUNITY)
Admission: EM | Admit: 2021-12-07 | Discharge: 2021-12-07 | Disposition: A | Discharge status: Home / Self Care | Payer: No Typology Code available for payment source | Attending: Emergency Medicine | Admitting: Emergency Medicine

## 2021-12-07 ENCOUNTER — Encounter (HOSPITAL_COMMUNITY): Payer: Self-pay

## 2021-12-07 ENCOUNTER — Emergency Department (HOSPITAL_COMMUNITY): Payer: No Typology Code available for payment source

## 2021-12-07 DIAGNOSIS — S199XXA Unspecified injury of neck, initial encounter: Secondary | ICD-10-CM | POA: Diagnosis not present

## 2021-12-07 DIAGNOSIS — Y92410 Unspecified street and highway as the place of occurrence of the external cause: Secondary | ICD-10-CM | POA: Diagnosis not present

## 2021-12-07 DIAGNOSIS — S4992XA Unspecified injury of left shoulder and upper arm, initial encounter: Secondary | ICD-10-CM | POA: Insufficient documentation

## 2021-12-07 MED ORDER — CYCLOBENZAPRINE HCL 10 MG PO TABS: 10.0000 mg | ORAL_TABLET | Freq: Two times a day (BID) | ORAL | 0 refills | Status: AC | PRN

## 2021-12-07 NOTE — Discharge Instructions (Signed)
Likely you have strained the muscles in your back and neck given a muscle relaxer take as prescribed.  Recommend over-the-counter pain medication as needed. ? ?Follow-up as needed ? ?Come back to the emergency department if you develop chest pain, shortness of breath, severe abdominal pain, uncontrolled nausea, vomiting, diarrhea. ? ?
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
## 2024-01-27 IMAGING — DX DG CERVICAL SPINE COMPLETE 4+V
6 series · 6 of 6 positions shown · non-contrast
Comparison: None.

CLINICAL DATA: Neck pain

EXAM:
CERVICAL SPINE - COMPLETE 4+ VIEW

[c-spine lat]
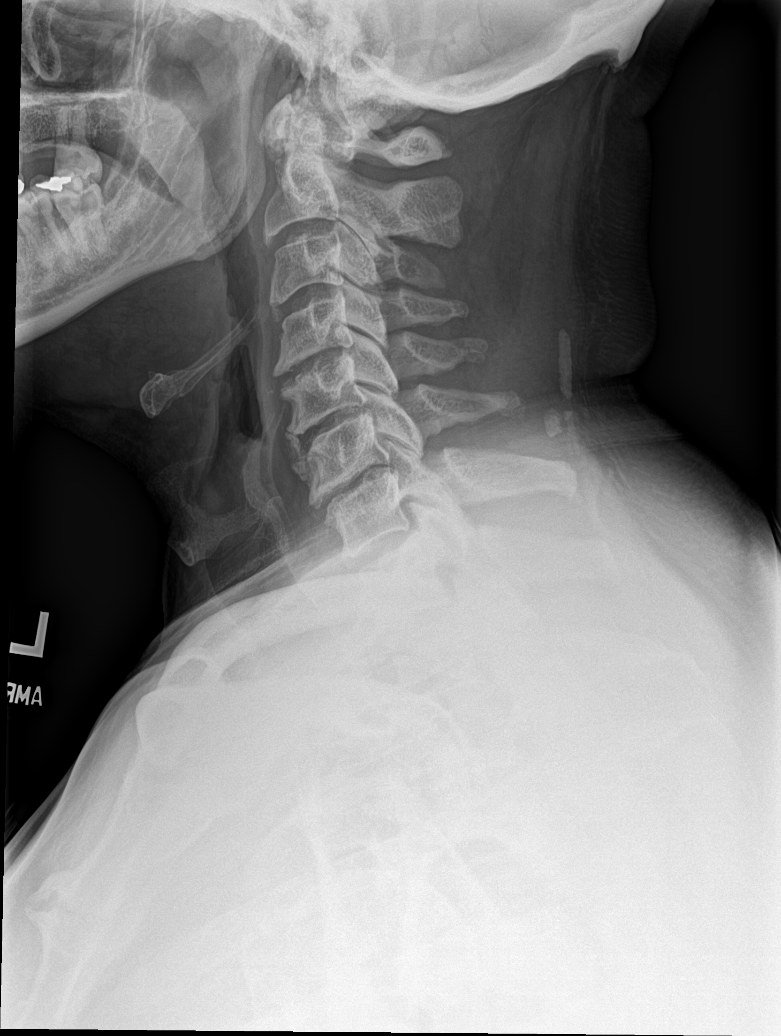

[c-spine obl (1 of 2)]
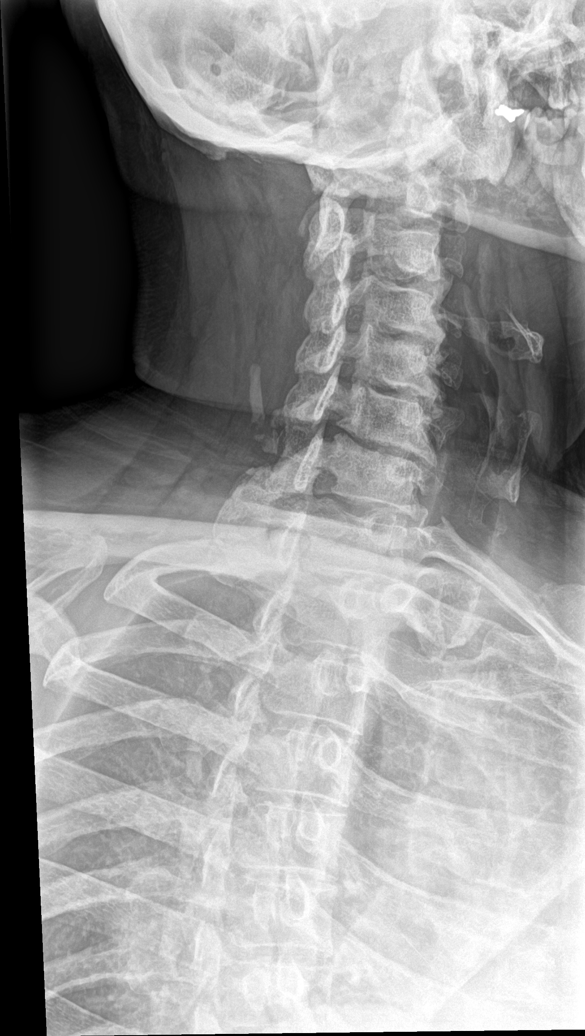

[c-spine obl (2 of 2)]
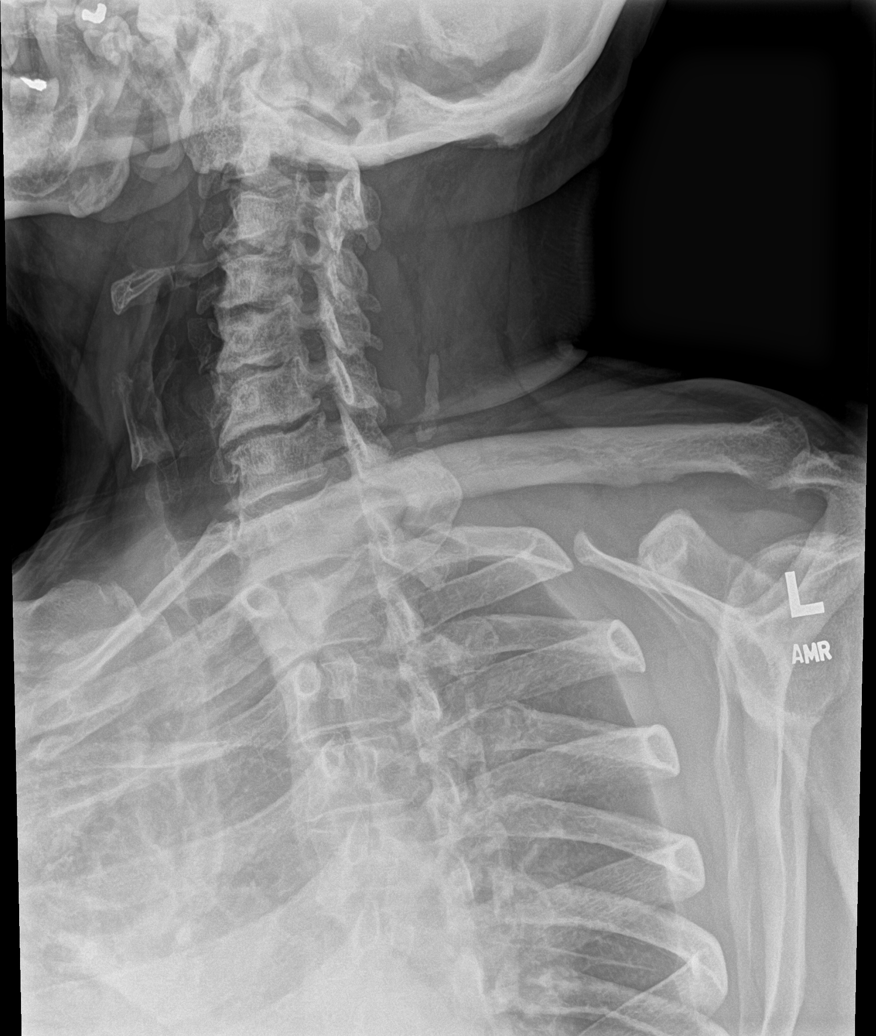

[c-spine ap]
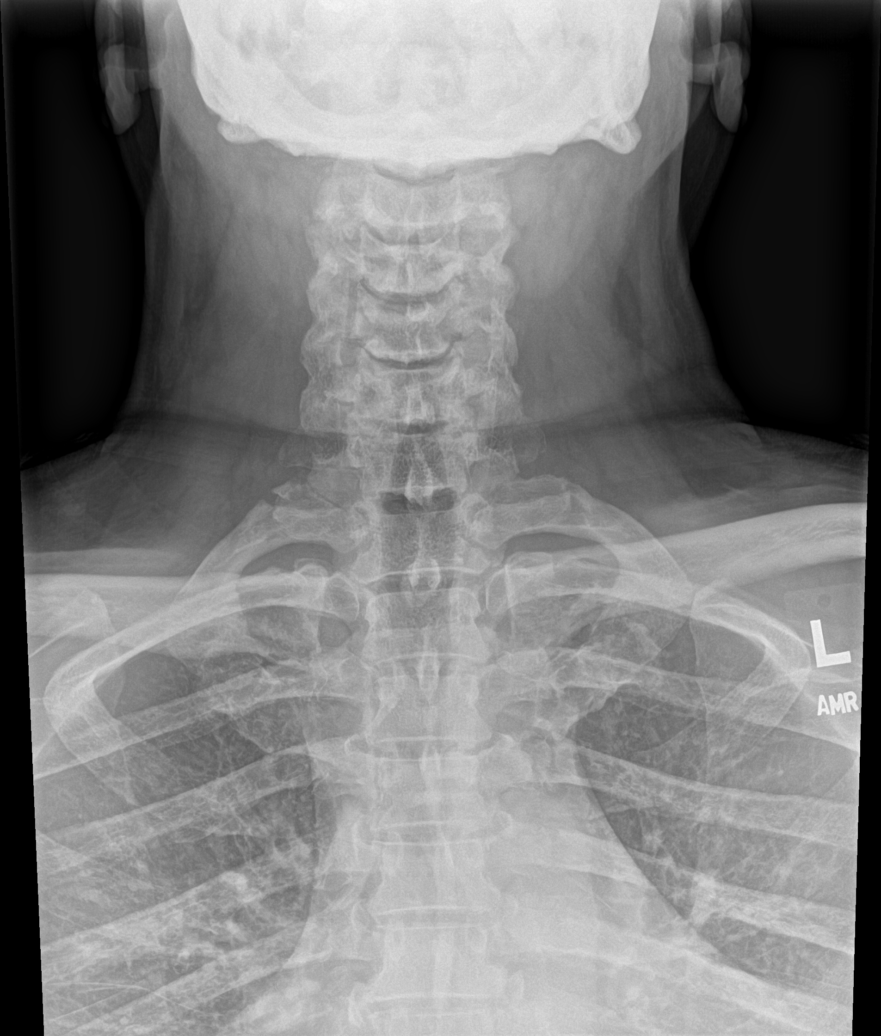

[c-spine open mouth]
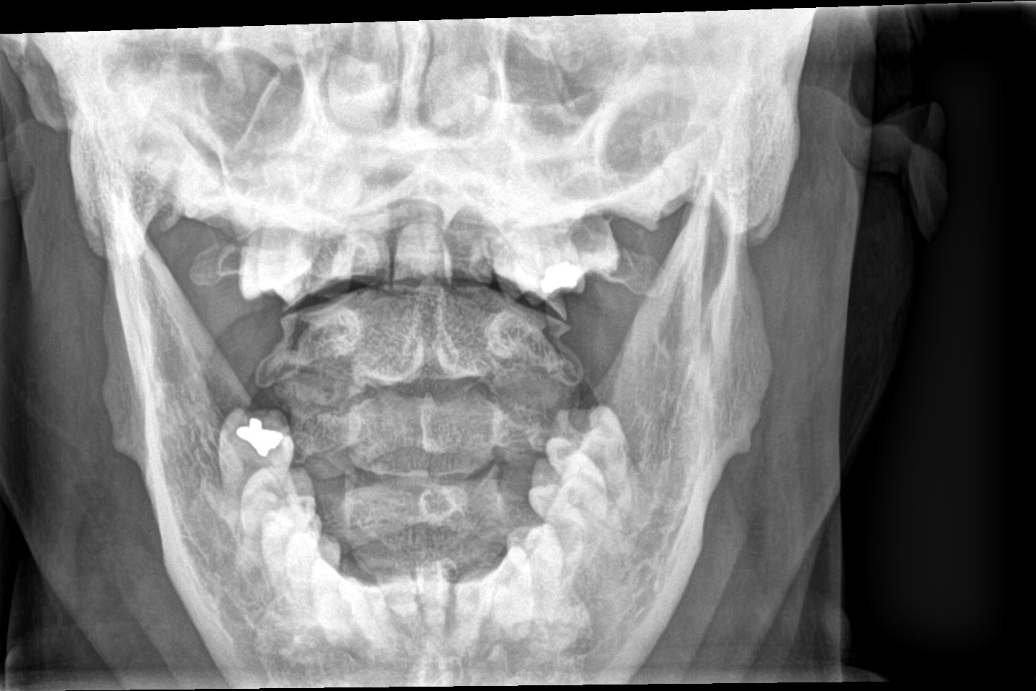

[ct-spine swimmers]
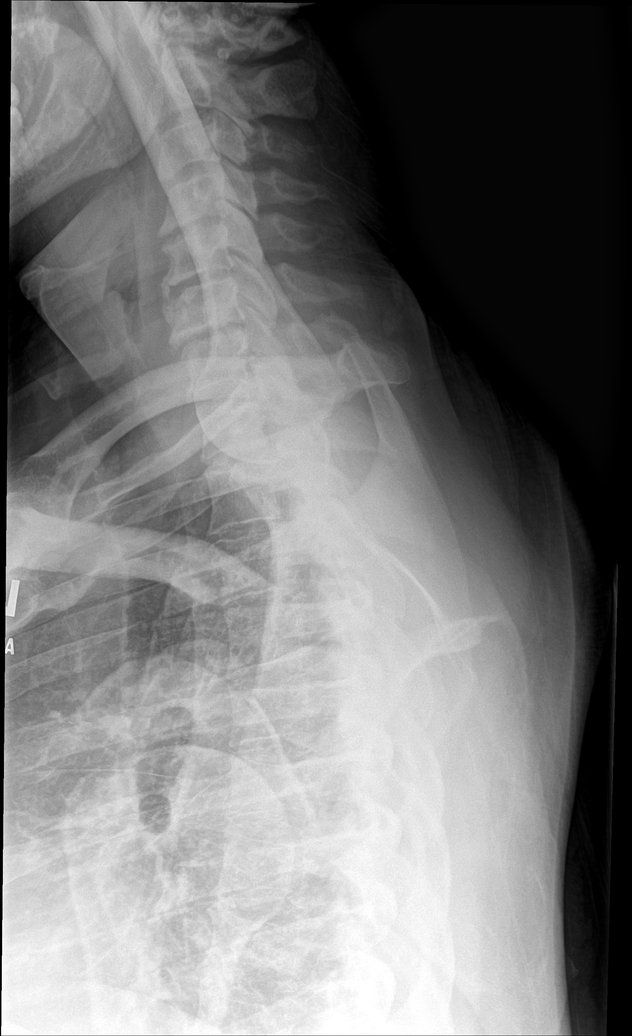

[6 of 6 positions shown; findings below may reference images not displayed]

FINDINGS: No acute fracture or malalignment identified. Mild to moderate
intervertebral disc space narrowing from C4 through C7 with mild
bilateral neural foraminal narrowing. No prevertebral soft tissue
swelling visualized.
IMPRESSION: Degenerative changes with no acute fracture or malalignment
identified.

## 2024-02-15 ENCOUNTER — Other Ambulatory Visit: Payer: Self-pay
